# Patient Record
Sex: Female | Born: 1940 | Race: Black or African American | Hispanic: No
Health system: Southern US, Community
[De-identification: ages and names within clinical notes are randomized; demographics above are authoritative.]

---

## 2018-04-08 ENCOUNTER — Emergency Department (HOSPITAL_COMMUNITY)
Admission: EM | Admit: 2018-04-08 | Discharge: 2018-04-08 | Disposition: A | Discharge status: Home / Self Care | Payer: Medicare Other | Attending: Emergency Medicine | Admitting: Emergency Medicine

## 2018-04-08 ENCOUNTER — Emergency Department (HOSPITAL_COMMUNITY): Payer: Medicare Other

## 2018-04-08 ENCOUNTER — Encounter (HOSPITAL_COMMUNITY): Payer: Self-pay | Admitting: Emergency Medicine

## 2018-04-08 ENCOUNTER — Other Ambulatory Visit: Payer: Self-pay

## 2018-04-08 DIAGNOSIS — M542 Cervicalgia: Secondary | ICD-10-CM | POA: Insufficient documentation

## 2018-04-08 DIAGNOSIS — S0990XA Unspecified injury of head, initial encounter: Secondary | ICD-10-CM | POA: Diagnosis present

## 2018-04-08 DIAGNOSIS — Y92009 Unspecified place in unspecified non-institutional (private) residence as the place of occurrence of the external cause: Secondary | ICD-10-CM | POA: Insufficient documentation

## 2018-04-08 DIAGNOSIS — M25512 Pain in left shoulder: Secondary | ICD-10-CM | POA: Diagnosis not present

## 2018-04-08 DIAGNOSIS — S0181XA Laceration without foreign body of other part of head, initial encounter: Secondary | ICD-10-CM | POA: Diagnosis not present

## 2018-04-08 DIAGNOSIS — Y999 Unspecified external cause status: Secondary | ICD-10-CM | POA: Diagnosis not present

## 2018-04-08 DIAGNOSIS — T148XXA Other injury of unspecified body region, initial encounter: Secondary | ICD-10-CM

## 2018-04-08 DIAGNOSIS — Y9301 Activity, walking, marching and hiking: Secondary | ICD-10-CM | POA: Insufficient documentation

## 2018-04-08 DIAGNOSIS — W108XXA Fall (on) (from) other stairs and steps, initial encounter: Secondary | ICD-10-CM | POA: Diagnosis not present

## 2018-04-08 LAB — BASIC METABOLIC PANEL
Anion gap: 10 (ref 5–15)
BUN: 13 mg/dL (ref 8–23)
CO2: 25 mmol/L (ref 22–32)
Calcium: 9.4 mg/dL (ref 8.9–10.3)
Chloride: 104 mmol/L (ref 98–111)
Creatinine, Ser: 0.8 mg/dL (ref 0.44–1.00)
GFR calc Af Amer: >60 mL/min (ref >60)
Glucose, Bld: 121 mg/dL — ABNORMAL HIGH (ref 70–99)
Potassium: 3.3 mmol/L — ABNORMAL LOW (ref 3.5–5.1)
Sodium: 139 mmol/L (ref 135–145)

## 2018-04-08 LAB — CBC
HCT: 39.5 % (ref 36.0–46.0)
Hemoglobin: 12.5 g/dL (ref 12.0–15.0)
MCH: 28.9 pg (ref 26.0–34.0)
MCHC: 31.6 g/dL (ref 30.0–36.0)
MCV: 91.4 fL (ref 80.0–100.0)
PLATELETS: 215 10*3/uL (ref 150–400)
RBC: 4.32 MIL/uL (ref 3.87–5.11)
RDW: 13.5 % (ref 11.5–15.5)
WBC: 5.5 10*3/uL (ref 4.0–10.5)
nRBC: 0 % (ref 0.0–0.2)

## 2018-04-08 MED ORDER — LIDOCAINE-EPINEPHRINE (PF) 2 %-1:200000 IJ SOLN
10.0000 mL | Freq: Once | INTRAMUSCULAR | Status: AC
  Administered 2018-04-08: 10 mL
  Filled 2018-04-08: qty 20

## 2018-04-08 MED ORDER — LIDOCAINE-EPINEPHRINE-TETRACAINE (LET) SOLUTION
3.0000 mL | Freq: Once | NASAL | Status: AC
  Administered 2018-04-08: 3 mL via TOPICAL
  Filled 2018-04-08: qty 3

## 2018-04-08 NOTE — ED Triage Notes (Signed)
Patient BIB [facility] from home. Had a mechanical fall down 6 stairs onto hardwood floor. No LOC but did hit her head and has a puncture wound and hematoma on her forehead. Patient aox4. No blood thinners. Hx of HTN.  Last vitals per EMS 182/100 HR 90 RR 18 94% RA

## 2018-04-08 NOTE — Discharge Instructions (Addendum)
Suture removal in 5 days, apply antibiotic ointment to the wound daily, can take over-the-counter medications as needed for your pain

## 2018-04-08 NOTE — ED Provider Notes (Signed)
..  Laceration Repair Date/Time: 04/08/2018 8:16 PM Performed by: Sherene Sires, PA-C Authorized by: Linwood Dibbles, MD   Consent:    Consent obtained:  Verbal   Consent given by:  Patient   Risks discussed:  Infection, pain, retained foreign body, need for additional repair, poor cosmetic result, tendon damage, nerve damage and poor wound healing   Alternatives discussed:  No treatment Anesthesia (see MAR for exact dosages):    Anesthesia method:  Local infiltration   Local anesthetic:  Lidocaine 2% WITH epi Laceration details:    Location:  Face   Face location:  Forehead   Length (cm):  1.5 Repair type:    Repair type:  Simple Pre-procedure details:    Preparation:  Patient was prepped and draped in usual sterile fashion Exploration:    Hemostasis achieved with:  Epinephrine   Wound exploration: entire depth of wound probed and visualized     Contaminated: no   Treatment:    Area cleansed with:  Saline   Amount of cleaning:  Standard   Irrigation solution:  Sterile water   Irrigation method:  Syringe   Visualized foreign bodies/material removed: no   Skin repair:    Repair method:  Sutures   Suture size:  5-0   Suture material:  Prolene   Suture technique:  Simple interrupted   Number of sutures:  3 Approximation:    Approximation:  Close Post-procedure details:    Dressing:  Open (no dressing)      Sherene Sires, PA-C 04/08/18 2020    Linwood Dibbles, MD 04/10/18 [phone]

## 2018-04-08 NOTE — ED Notes (Signed)
Patient verbalizes understanding of discharge instructions. Opportunity for questioning and answers were provided. Armband removed by staff, pt discharged from ED.  

## 2018-04-08 NOTE — ED Provider Notes (Signed)
[facility] EMERGENCY DEPARTMENT Provider Note   CSN: [id number] Arrival date & time: 04/08/18  1609    History   Chief Complaint Chief Complaint  Patient presents with  . Fall    HPI Heidi Chavez is a 78 y.o. female.   HPI Patient presents to the emergency room for evaluation of a head injury after a fall.  Patient was walking down her stairs today when she accidentally slipped and fell.  Patient struck her forehead against the floor.  She seen the laceration.  Patient did not lose consciousness but she states she heard a large crack and was dazed.  She is not on anti-anticoagulants.  She denies any focal numbness or weakness.  No chest pain or shortness of breath.  She does have pain in her head.  She also has some pain in her neck and her left shoulder. History reviewed. No pertinent past medical history.  There are no active problems to display for this patient.   History reviewed. No pertinent surgical history.   OB History   No obstetric history on file.      Home Medications    Prior to Admission medications   Not on File    Family History No family history on file.  Social History Social History   Tobacco Use  . Smoking status: Not on file  Substance Use Topics  . Alcohol use: Not on file  . Drug use: Not on file     Allergies   Patient has no known allergies.   Review of Systems Review of Systems  All other systems reviewed and are negative.    Physical Exam Updated Vital Signs BP (!) 148/91   Pulse 91   Temp 98.1 F (36.7 C) (Oral)   Resp 16   Ht 1.626 m (5\' 4" )   Wt 90.7 kg   SpO2 99%   BMI 34.33 kg/m   Physical Exam Vitals signs and nursing note reviewed.  Constitutional:      General: She is not in acute distress.    Appearance: She is well-developed.  HENT:     Head: Normocephalic and atraumatic.     Comments: Hematoma, laceration center of forehead    Right Ear: External ear normal.     Left  Ear: External ear normal.  Eyes:     General: No scleral icterus.       Right eye: No discharge.        Left eye: No discharge.     Conjunctiva/sclera: Conjunctivae normal.  Neck:     Musculoskeletal: Neck supple.     Trachea: No tracheal deviation.  Cardiovascular:     Rate and Rhythm: Normal rate and regular rhythm.  Pulmonary:     Effort: Pulmonary effort is normal. No respiratory distress.     Breath sounds: Normal breath sounds. No stridor. No wheezing or rales.  Abdominal:     General: Bowel sounds are normal. There is no distension.     Palpations: Abdomen is soft.     Tenderness: There is no abdominal tenderness. There is no guarding or rebound.  Musculoskeletal:     Right shoulder: She exhibits no tenderness, no bony tenderness and no swelling.     Left shoulder: She exhibits tenderness and bony tenderness. She exhibits no swelling.     Right wrist: She exhibits no tenderness, no bony tenderness and no swelling.     Left wrist: She exhibits no tenderness, no bony tenderness and no  swelling.     Right hip: She exhibits normal range of motion, no tenderness, no bony tenderness and no swelling.     Left hip: She exhibits normal range of motion, no tenderness and no bony tenderness.     Right ankle: She exhibits no swelling. No tenderness.     Left ankle: She exhibits no swelling. No tenderness.     Cervical back: She exhibits tenderness. She exhibits no bony tenderness and no swelling.     Thoracic back: She exhibits no tenderness, no bony tenderness and no swelling.     Lumbar back: She exhibits no tenderness, no bony tenderness and no swelling.  Skin:    General: Skin is warm and dry.     Findings: No rash.  Neurological:     Mental Status: She is alert.     Cranial Nerves: No cranial nerve deficit (no facial droop, extraocular movements intact, no slurred speech).     Sensory: No sensory deficit.     Motor: No abnormal muscle tone or seizure activity.     Coordination:  Coordination normal.      ED Treatments / Results  Labs (all labs ordered are listed, but only abnormal results are displayed) Labs Reviewed  BASIC METABOLIC PANEL - Abnormal; Notable for the following components:      Result Value   Potassium 3.3 (*)    Glucose, Bld 121 (*)    All other components within normal limits  CBC     Radiology Ct Head Wo Contrast  Result Date: 04/08/2018 CLINICAL DATA:  78 y/o F; mechanical fall down 6 stairs onto hardwood floor. EXAM: CT HEAD WITHOUT CONTRAST CT CERVICAL SPINE WITHOUT CONTRAST TECHNIQUE: Multidetector CT imaging of the head and cervical spine was performed following the standard protocol without intravenous contrast. Multiplanar CT image reconstructions of the cervical spine were also generated. COMPARISON:  None. FINDINGS: CT HEAD FINDINGS Brain: No evidence of acute infarction, hemorrhage, hydrocephalus, extra-axial collection or mass lesion/mass effect. Few nonspecific white matter hypodensities are compatible with chronic microvascular ischemic changes and there is volume loss of the brain. Vascular: Calcific atherosclerosis of carotid siphons. No hyperdense vessel. Skull: Normal. Negative for fracture or focal lesion. Sinuses/Orbits: Left frontal scalp contusion and laceration. No calvarial fracture Other: None. CT CERVICAL SPINE FINDINGS Alignment: Mild reversal of cervical curvature. No listhesis. Skull base and vertebrae: No acute fracture. No primary bone lesion or focal pathologic process. Soft tissues and spinal canal: No prevertebral fluid or swelling. No visible canal hematoma. Disc levels: Advanced cervical spondylosis with multilevel discogenic and facet degenerative changes. Bilateral C4-5 facet fusion on degenerative basis. Uncovertebral and facet hypertrophy contribute to neural foraminal stenosis at the bilateral C3-4, bilateral C4-5, bilateral C5-6, bilateral C6-7 levels. There multiple levels of mild-to-moderate spinal canal  stenosis greatest at C6-7. Upper chest: Negative. Other: Negative. IMPRESSION: CT head: 1. Left frontal scalp contusion and laceration. No calvarial fracture. 2. No acute intracranial abnormality. 3. Mild chronic microvascular ischemic changes and volume loss of the brain. CT cervical spine: 1. No acute fracture or dislocation identified. 2. Advanced cervical spondylosis greatest at C6-7. Electronically Signed   By: Mitzi HansenLance  Furusawa-Stratton M.D.   On: 04/08/2018 18:32   Ct Cervical Spine Wo Contrast  Result Date: 04/08/2018 CLINICAL DATA:  78 y/o F; mechanical fall down 6 stairs onto hardwood floor. EXAM: CT HEAD WITHOUT CONTRAST CT CERVICAL SPINE WITHOUT CONTRAST TECHNIQUE: Multidetector CT imaging of the head and cervical spine was performed following the standard protocol without  intravenous contrast. Multiplanar CT image reconstructions of the cervical spine were also generated. COMPARISON:  None. FINDINGS: CT HEAD FINDINGS Brain: No evidence of acute infarction, hemorrhage, hydrocephalus, extra-axial collection or mass lesion/mass effect. Few nonspecific white matter hypodensities are compatible with chronic microvascular ischemic changes and there is volume loss of the brain. Vascular: Calcific atherosclerosis of carotid siphons. No hyperdense vessel. Skull: Normal. Negative for fracture or focal lesion. Sinuses/Orbits: Left frontal scalp contusion and laceration. No calvarial fracture Other: None. CT CERVICAL SPINE FINDINGS Alignment: Mild reversal of cervical curvature. No listhesis. Skull base and vertebrae: No acute fracture. No primary bone lesion or focal pathologic process. Soft tissues and spinal canal: No prevertebral fluid or swelling. No visible canal hematoma. Disc levels: Advanced cervical spondylosis with multilevel discogenic and facet degenerative changes. Bilateral C4-5 facet fusion on degenerative basis. Uncovertebral and facet hypertrophy contribute to neural foraminal stenosis at the  bilateral C3-4, bilateral C4-5, bilateral C5-6, bilateral C6-7 levels. There multiple levels of mild-to-moderate spinal canal stenosis greatest at C6-7. Upper chest: Negative. Other: Negative. IMPRESSION: CT head: 1. Left frontal scalp contusion and laceration. No calvarial fracture. 2. No acute intracranial abnormality. 3. Mild chronic microvascular ischemic changes and volume loss of the brain. CT cervical spine: 1. No acute fracture or dislocation identified. 2. Advanced cervical spondylosis greatest at C6-7. Electronically Signed   By: Mitzi Hansen M.D.   On: 04/08/2018 18:32   Dg Shoulder Left  Result Date: 04/08/2018 CLINICAL DATA:  78 y/o F; superior and posterior left shoulder pain. EXAM: LEFT SHOULDER - 2+ VIEW COMPARISON:  None. FINDINGS: There is no evidence of fracture or dislocation. Sclerosis of the superolateral humeral head may represent rotator cuff insertional degenerative changes or chronic Hill-Sachs deformity. Normal acromioclavicular and coracoclavicular intervals. IMPRESSION: No acute fracture or dislocation identified.Sclerosis of the superolateral humeral head may represent rotator cuff insertional degenerative changes or chronic Hill-Sachs deformity. Electronically Signed   By: Mitzi Hansen M.D.   On: 04/08/2018 18:20    Procedures Procedures (including critical care time)  Medications Ordered in ED Medications  lidocaine-EPINEPHrine-tetracaine (LET) solution (3 mLs Topical Given 04/08/18 1917)  lidocaine-EPINEPHrine (XYLOCAINE W/EPI) 2 %-1:200000 (PF) injection 10 mL (10 mLs Infiltration Given 04/08/18 1916)     Initial Impression / Assessment and Plan / ED Course  I have reviewed the triage vital signs and the nursing notes.  Pertinent labs & imaging results that were available during my care of the patient were reviewed by me and considered in my medical decision making (see chart for details).  Clinical Course as of Apr 08 1942  Thu Apr 08, 2018  1944 Labs reviewed.  No significant abnormalities.  X-rays and CT scans without fracture or serious injury   [JK]    Clinical Course User Index [JK] Linwood Dibbles, MD    Patient presented to the ED for evaluation after a fall.  Fortunately no signs of any serious injury.  Patient did have a forehead laceration.  This was repaired by PA Albrizze.  Final Clinical Impressions(s) / ED Diagnoses   Final diagnoses:  Laceration of forehead, initial encounter  Muscle strain    ED Discharge Orders    None       Linwood Dibbles, MD 04/08/18 1945

## 2018-04-08 NOTE — ED Notes (Addendum)
Patient transported to XR then CT.

## 2020-06-30 IMAGING — CT CT CERVICAL SPINE W/O CM
5 of 8 series · 11 of 33 positions shown, 12 images · non-contrast
Comparison: None.

CLINICAL DATA: 78 y/o F; mechanical fall down 6 stairs onto
hardwood floor.

EXAM:
CT HEAD WITHOUT CONTRAST
CT CERVICAL SPINE WITHOUT CONTRAST
TECHNIQUE: Multidetector CT imaging of the head and cervical spine was
performed following the standard protocol without intravenous
contrast. Multiplanar CT image reconstructions of the cervical spine
were also generated.

[Series 5: head bone · axial · 0.43mm/px · z∈[+1400,+1456]mm · 2 of 84 slices shown]
[im 28/84  bone]
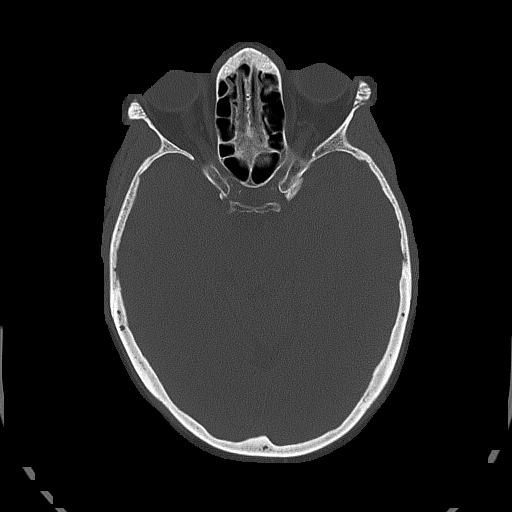
[im 56/84  bone]
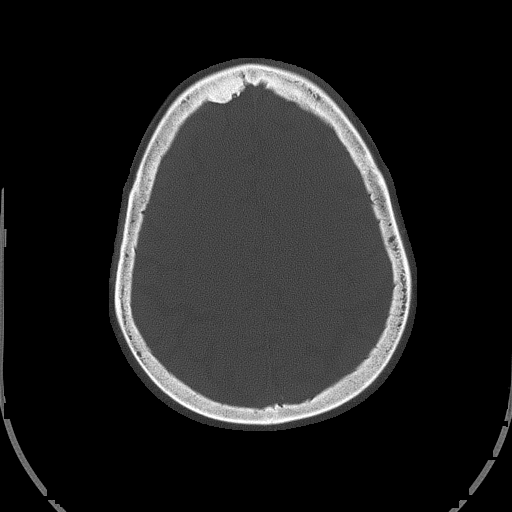

[Series 8: c_spine 2.0 st · axial · 0.28mm/px · z∈[+1272,+1334]mm · 2 of 95 slices shown, 3 images]
[im 32/95  soft-tissue]
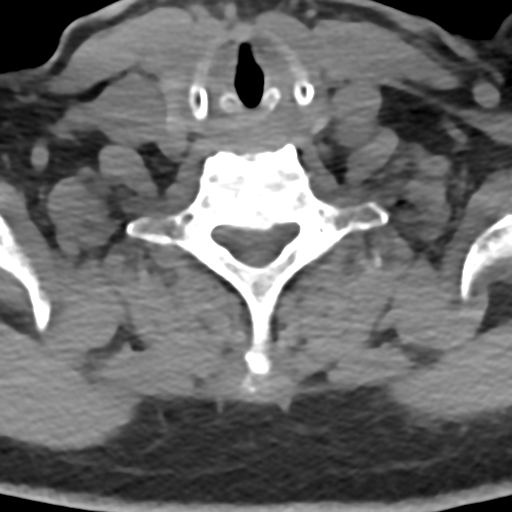
[im 32/95  bone]
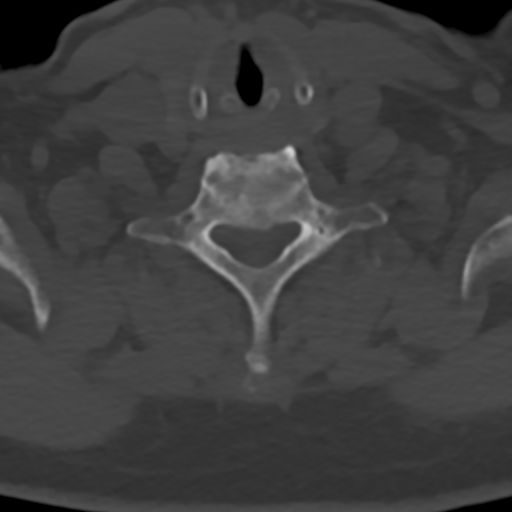
[im 63/95  bone]
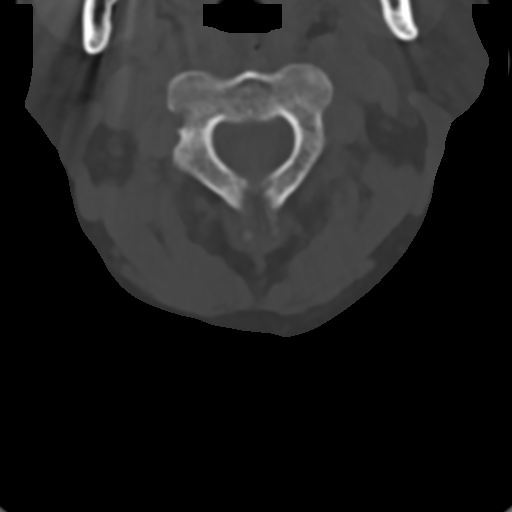

[Series 12: c_spine 2.0 sag bone · sagittal · 0.27mm/px · 4 of 61 slices shown]
[im 13/61  bone]
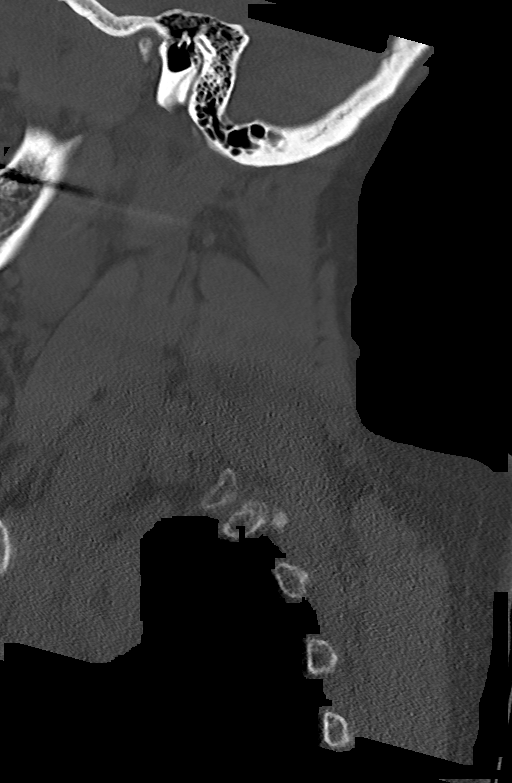
[im 25/61  bone]
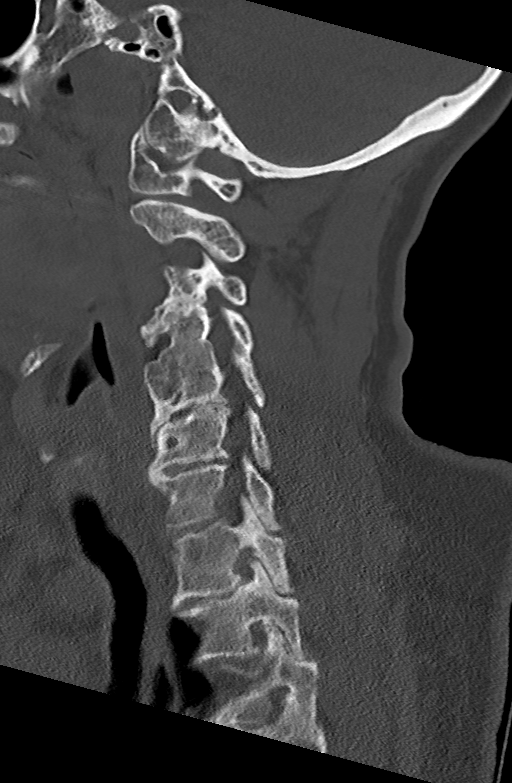
[im 37/61  bone]
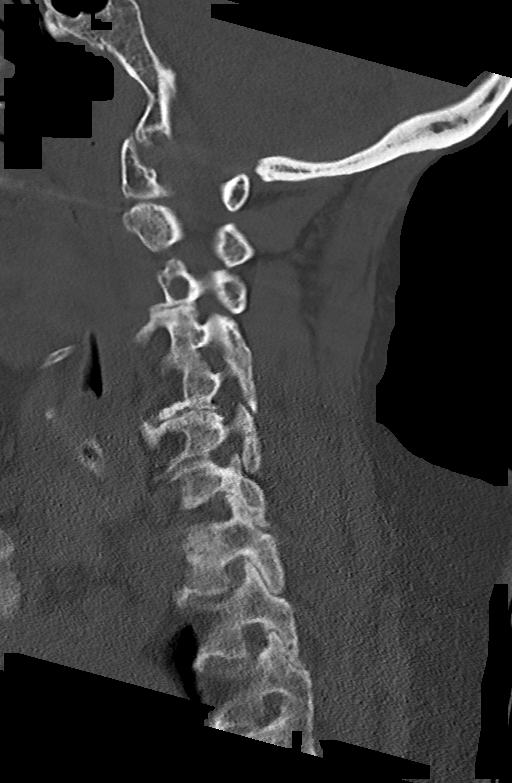
[im 49/61  bone]
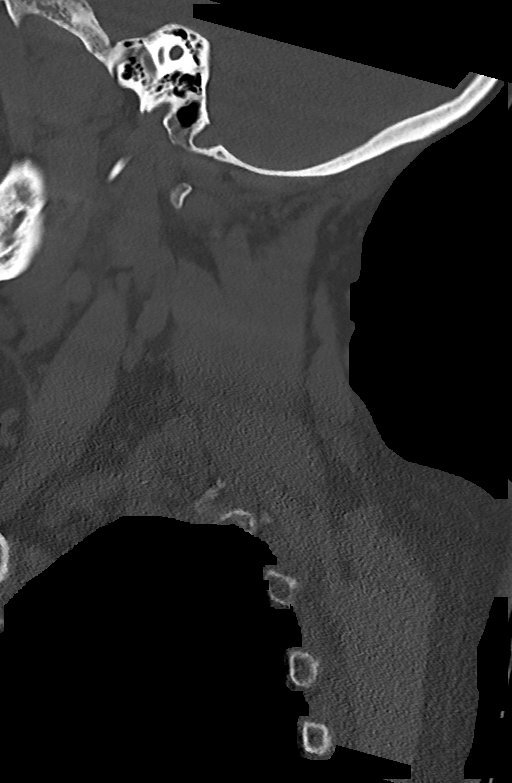

[Series 13: c_spine 2.0 cor bone · coronal · 0.28mm/px · 1 of 61 slices shown]
[im 31/61  bone]
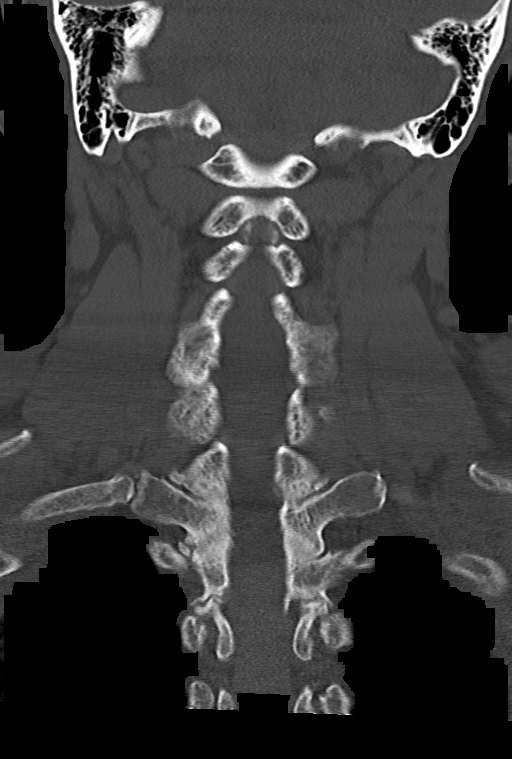

[Series 14: c_spine 2.0 orthogonals · axial · 0.21mm/px · z∈[+1252,+1300]mm · 2 of 89 slices shown]
[im 30/89  bone]
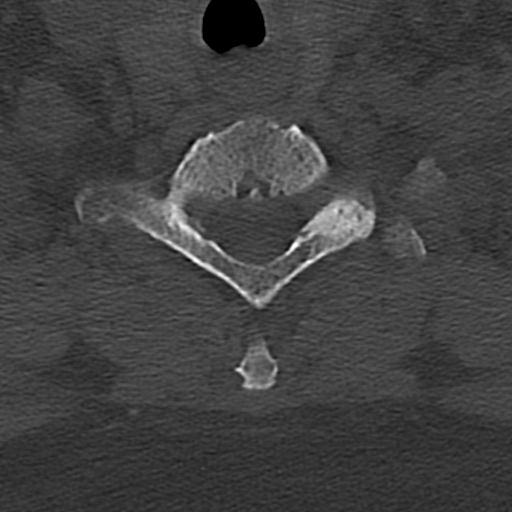
[im 59/89  bone]
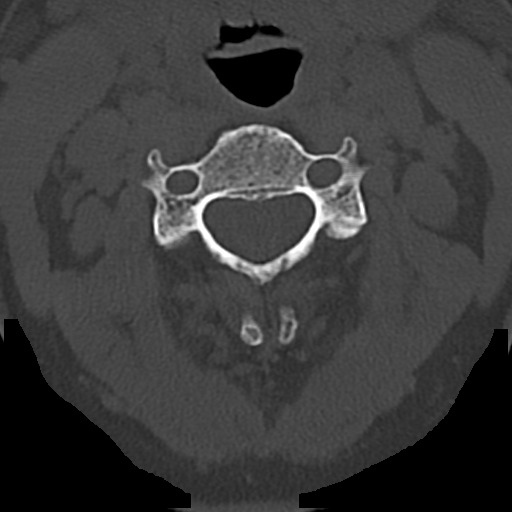

[11 of 33 positions shown; findings below may reference images not displayed]

FINDINGS: CT HEAD FINDINGS

Brain: No evidence of acute infarction, hemorrhage, hydrocephalus,
extra-axial collection or mass lesion/mass effect. Few nonspecific
white matter hypodensities are compatible with chronic microvascular
ischemic changes and there is volume loss of the brain.

Vascular: Calcific atherosclerosis of carotid siphons. No hyperdense
vessel.

Skull: Normal. Negative for fracture or focal lesion.

Sinuses/Orbits: Left frontal scalp contusion and laceration. No
calvarial fracture

Other: None.

CT CERVICAL SPINE FINDINGS

Alignment: Mild reversal of cervical curvature. No listhesis.

Skull base and vertebrae: No acute fracture. No primary bone lesion
or focal pathologic process.

Soft tissues and spinal canal: No prevertebral fluid or swelling. No
visible canal hematoma.

Disc levels: Advanced cervical spondylosis with multilevel
discogenic and facet degenerative changes. Bilateral C4-5 facet
fusion on degenerative basis. Uncovertebral and facet hypertrophy
contribute to neural foraminal stenosis at the bilateral C3-4,
bilateral C4-5, bilateral C5-6, bilateral C6-7 levels. There
multiple levels of mild-to-moderate spinal canal stenosis greatest
at C6-7.

Upper chest: Negative.

Other: Negative.
IMPRESSION: CT head:

1. Left frontal scalp contusion and laceration. No calvarial
fracture.
2. No acute intracranial abnormality.
3. Mild chronic microvascular ischemic changes and volume loss of
the brain.

CT cervical spine:

1. No acute fracture or dislocation identified.
2. Advanced cervical spondylosis greatest at C6-7.

## 2020-06-30 IMAGING — DX DG SHOULDER 2+V*L*
3 series · 3 of 3 positions shown · non-contrast
Comparison: None.

CLINICAL DATA: 78 y/o F; superior and posterior left shoulder pain.

EXAM:
LEFT SHOULDER - 2+ VIEW

[shoulder grashey]
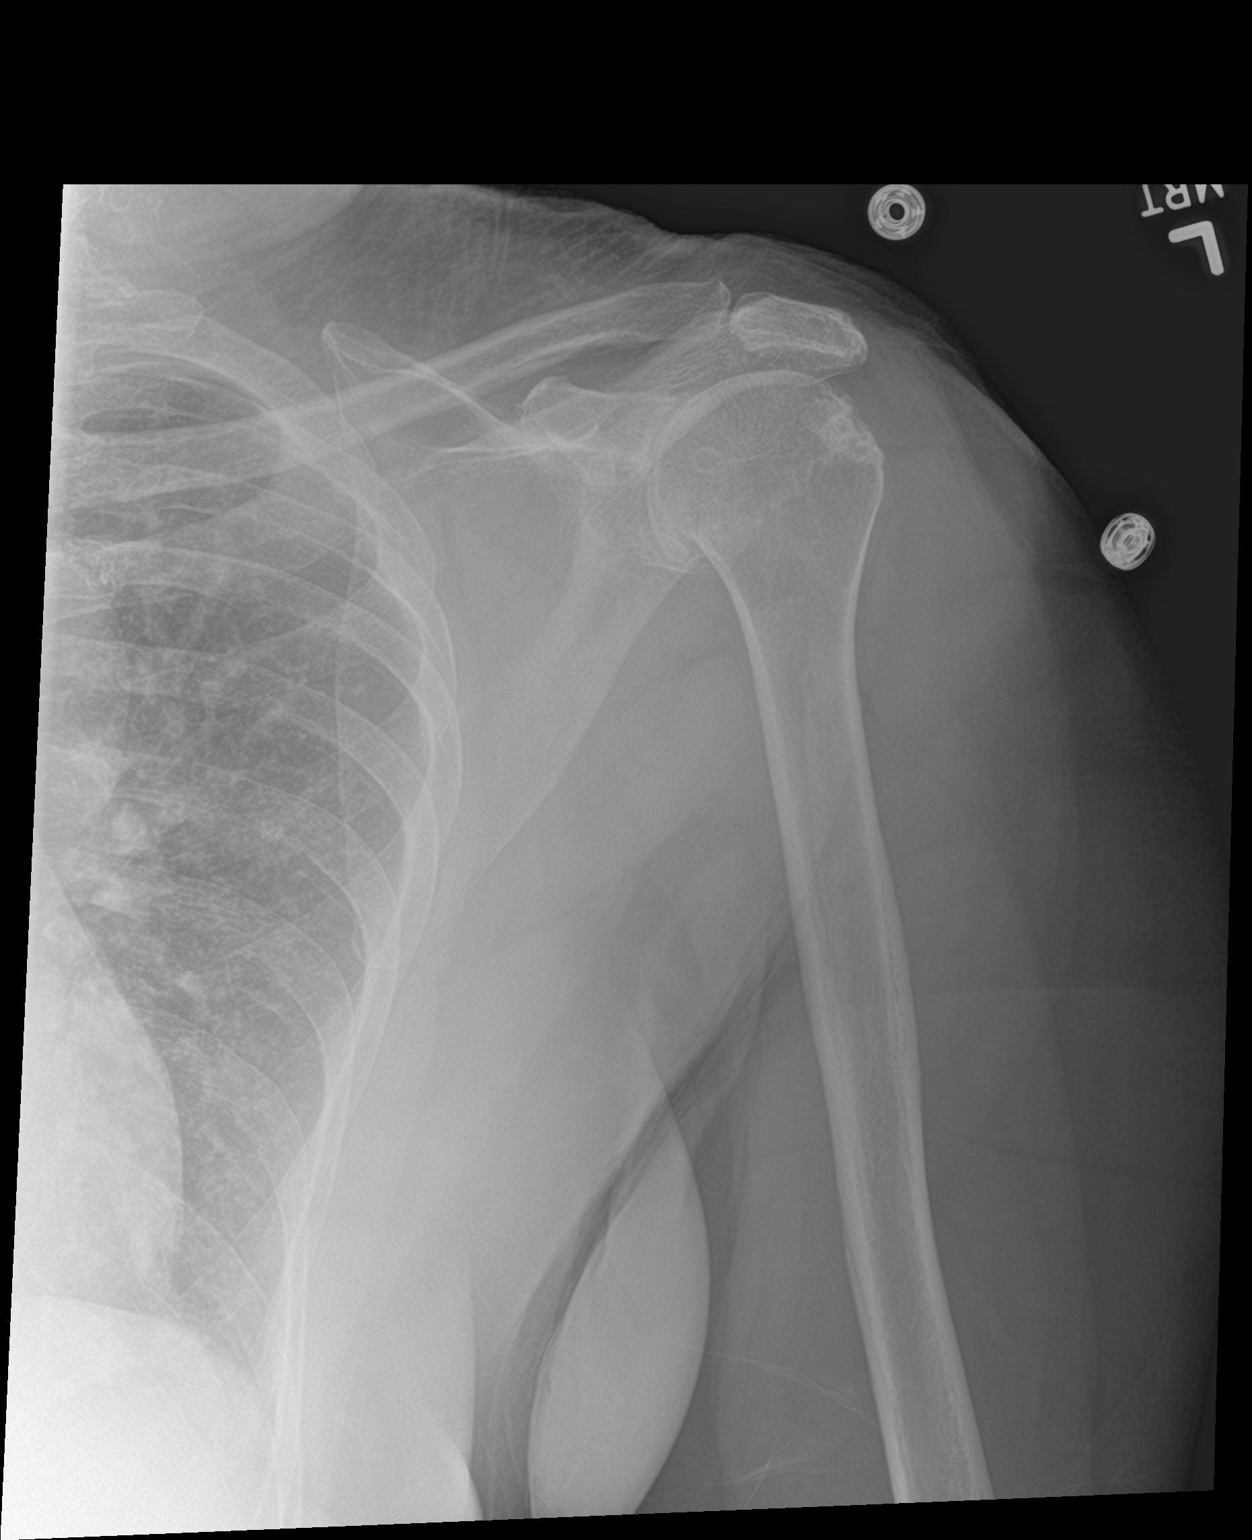

[shoulder y view]
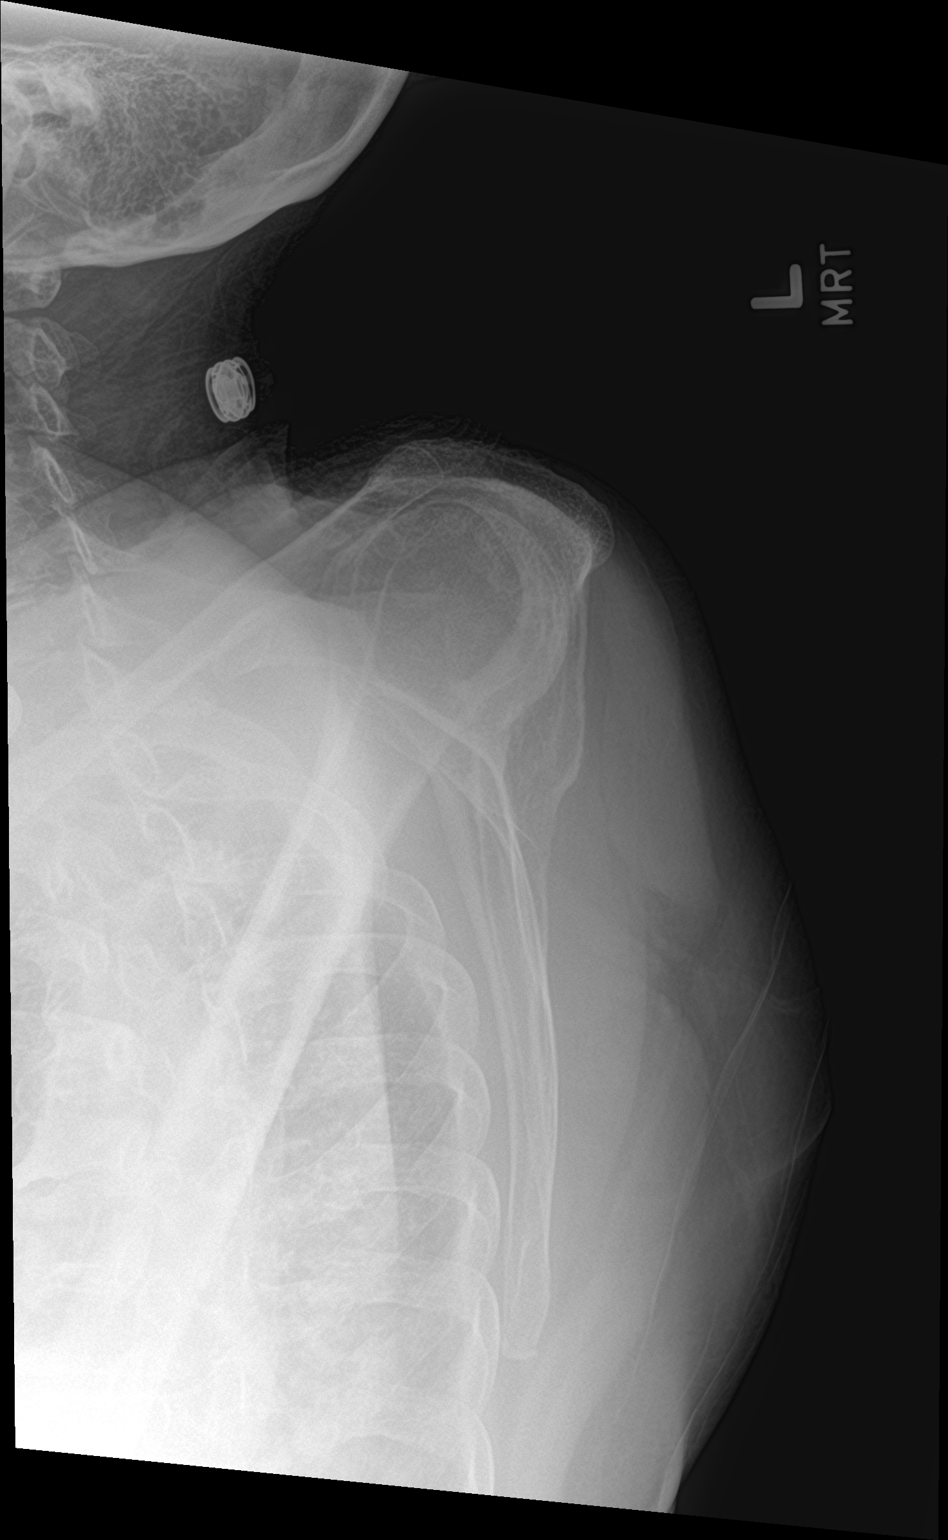

[shoulder axillary]
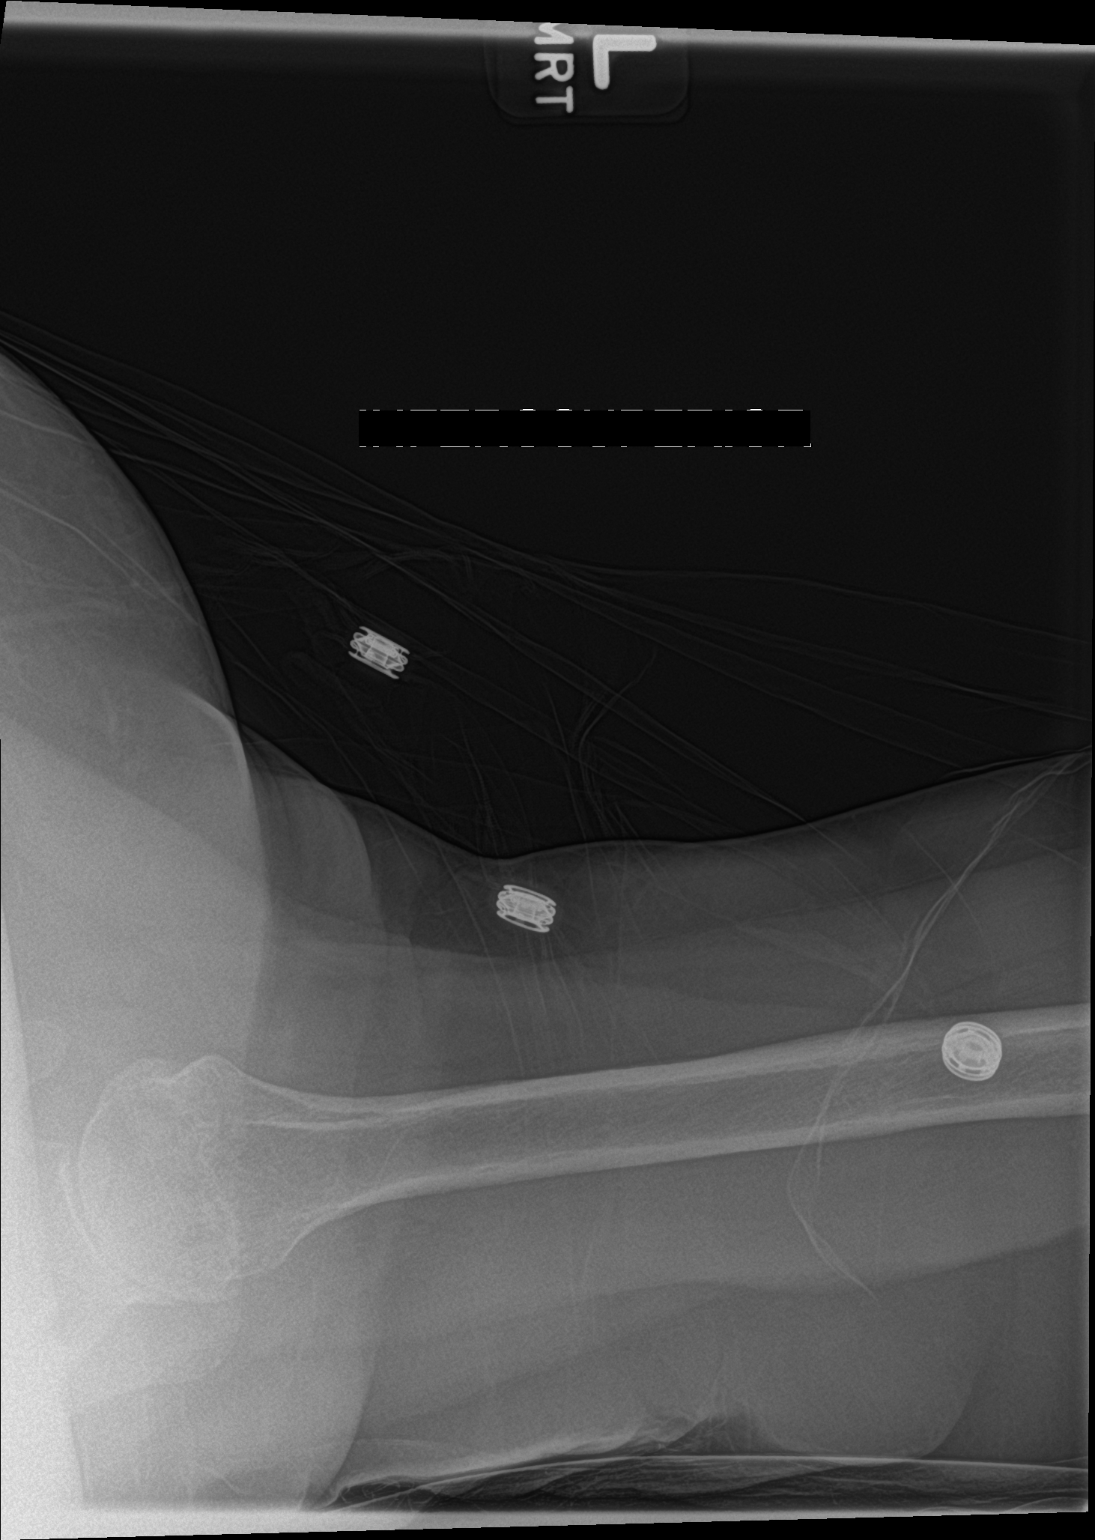

[3 of 3 positions shown; findings below may reference images not displayed]

FINDINGS: There is no evidence of fracture or dislocation. Sclerosis of the
superolateral humeral head may represent rotator cuff insertional
degenerative changes or chronic Hill-Sachs deformity. Normal
acromioclavicular and coracoclavicular intervals.
IMPRESSION: No acute fracture or dislocation identified.Sclerosis of the
superolateral humeral head may represent rotator cuff insertional
degenerative changes or chronic Hill-Sachs deformity.
